# Patient Record
Sex: Male | Born: 1945 | Race: White | Hispanic: No | Marital: Married | Smoking: Former smoker
Health system: Southern US, Community
[De-identification: ages and names within clinical notes are randomized; demographics above are authoritative.]

## PROBLEM LIST (undated history)

## (undated) DIAGNOSIS — E119 Type 2 diabetes mellitus without complications: Secondary | ICD-10-CM

## (undated) DIAGNOSIS — E039 Hypothyroidism, unspecified: Secondary | ICD-10-CM

## (undated) DIAGNOSIS — I2510 Atherosclerotic heart disease of native coronary artery without angina pectoris: Secondary | ICD-10-CM

## (undated) DIAGNOSIS — I10 Essential (primary) hypertension: Secondary | ICD-10-CM

## (undated) DIAGNOSIS — E785 Hyperlipidemia, unspecified: Secondary | ICD-10-CM

## (undated) HISTORY — DX: Essential (primary) hypertension: I10

## (undated) HISTORY — DX: Hyperlipidemia, unspecified: E78.5

## (undated) HISTORY — DX: Atherosclerotic heart disease of native coronary artery without angina pectoris: I25.10

## (undated) HISTORY — DX: Hypothyroidism, unspecified: E03.9

## (undated) HISTORY — DX: Type 2 diabetes mellitus without complications: E11.9

---

## 1988-06-04 DIAGNOSIS — S2249XA Multiple fractures of ribs, unspecified side, initial encounter for closed fracture: Secondary | ICD-10-CM

## 1988-06-04 HISTORY — DX: Multiple fractures of ribs, unspecified side, initial encounter for closed fracture: S22.49XA

## 1995-06-29 DIAGNOSIS — Z951 Presence of aortocoronary bypass graft: Secondary | ICD-10-CM

## 1995-06-29 HISTORY — DX: Presence of aortocoronary bypass graft: Z95.1

## 1995-10-21 DIAGNOSIS — I459 Conduction disorder, unspecified: Secondary | ICD-10-CM

## 1995-10-21 HISTORY — DX: Conduction disorder, unspecified: I45.9

## 2019-12-26 ENCOUNTER — Telehealth: Payer: Self-pay | Admitting: Internal Medicine

## 2019-12-26 ENCOUNTER — Ambulatory Visit (INDEPENDENT_AMBULATORY_CARE_PROVIDER_SITE_OTHER): Payer: Medicare Other | Admitting: Internal Medicine

## 2019-12-26 ENCOUNTER — Encounter: Payer: Self-pay | Admitting: Internal Medicine

## 2019-12-26 ENCOUNTER — Other Ambulatory Visit: Payer: Self-pay

## 2019-12-26 DIAGNOSIS — R053 Chronic cough: Secondary | ICD-10-CM | POA: Diagnosis not present

## 2019-12-26 DIAGNOSIS — R9389 Abnormal findings on diagnostic imaging of other specified body structures: Secondary | ICD-10-CM | POA: Insufficient documentation

## 2019-12-26 DIAGNOSIS — I10 Essential (primary) hypertension: Secondary | ICD-10-CM | POA: Diagnosis not present

## 2019-12-26 MED ORDER — TELMISARTAN 80 MG PO TABS: ORAL_TABLET | ORAL | 11 refills | Status: DC

## 2019-12-26 MED ORDER — TELMISARTAN 80 MG PO TABS: 80.0000 mg | ORAL_TABLET | Freq: Every day | ORAL | 11 refills | Status: DC

## 2019-12-26 NOTE — Assessment & Plan Note (Signed)
See Cxr 7/217/21 ? 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

## 2019-12-26 NOTE — Assessment & Plan Note (Addendum)
Onset x years prior to flare in July 2021 with L sided pna that cleared -s/p abn CT chest 12/05/19 see sep a/p  - D/c Altace 12/26/2019     Most likely acei vs sinusitis vs bronchiecatsis (which might not have been obvious on non HRCT)    >>> start with trial off acei then regroup in 6 weeks

## 2019-12-26 NOTE — Telephone Encounter (Signed)
Called and spoke with Lauren at the pharmacy to let her know that notes from Provider states:  Micardis (ibesartan) 80 mg half daily and the cough should gradually resolve - take a whole pill if blood pressure too high  She expressed understanding. Nothing further needed at this time.

## 2019-12-26 NOTE — Progress Notes (Signed)
Julian Castro, male    DOB: [birth year]     MRN: [id number]   Brief patient profile:  73 yowm quit smoking in 2007 on then prn  combivent but good activity tolerance and eventually  not req any resp rx then acutely ill around 1st of July 2021 with lingering cough just white never bloody with abn cxr 10/17/19 "R basilar opactiy"  so referred to pulmonary clinic in [place]  12/26/2019 by [place] in [place] with abn CT chest 12/05/19 showing "vs 10/17/19"  GG changes in RML.     History of Present Illness  12/26/2019  Pulmonary/ 1st office eval/ Lexis Potenza / [place] [facility]  Chief Complaint  Patient presents with  . Pulmonary Consult    [facility]  Pt states he has a lingering cough- occ prod with white sputum.    Dyspnea:  Fine including hills  Cough: worse at hs with noisy breathing since pna in July 2021 but actually had chronic cough on acei x years prior to this per wife (bothered her, not pt)  Sleep: flat one pillow  SABA use: not needing   No obvious day to day or daytime variability or assoc  purulent sputum or mucus plugs or hemoptysis or cp or chest tightness, subjective wheeze or overt sinus or hb symptoms.   He perceives he is sleeping  without nocturnal  or early am exacerbation  of respiratory  c/o's or need for noct saba. Also denies any obvious fluctuation of symptoms with weather or environmental changes or other aggravating or alleviating factors except as outlined above   No unusual exposure hx or h/o childhood pna/ asthma or knowledge of premature birth.  Current Allergies, Complete Past Medical History, Past Surgical History, Family History, and Social History were reviewed in [employer] record.  ROS  The following are not active complaints unless bolded Hoarseness, sore throat, dysphagia, dental problems, itching, sneezing,  nasal congestion or discharge of excess mucus or purulent secretions, ear ache,   fever,  chills, sweats, unintended wt loss or wt gain, classically pleuritic or exertional cp,  orthopnea pnd or arm/hand swelling  or leg swelling, presyncope, palpitations, abdominal pain, anorexia, nausea, vomiting, diarrhea  or change in bowel habits or change in bladder habits, change in stools or change in urine, dysuria, hematuria,  rash, arthralgias, visual complaints, headache, numbness, weakness or ataxia or problems with walking or coordination,  change in mood or  memory.            No past medical history on file.  Outpatient Medications Prior to Visit  Medication Sig Dispense Refill  . aspirin EC 81 MG tablet Take 81 mg by mouth daily. Swallow whole.    Marland Kitchen atorvastatin (LIPITOR) 40 MG tablet Take 40 mg by mouth daily.    . B Complex-C (SUPER B COMPLEX PO) Take 1 tablet by mouth daily.    . insulin detemir (LEVEMIR) 100 UNIT/ML injection Inject 75 Units into the skin daily.    . Ipratropium-Albuterol (COMBIVENT RESPIMAT) 20-100 MCG/ACT AERS respimat Inhale 1 puff into the lungs every 6 (six) hours as needed for wheezing.    . metFORMIN (GLUCOPHAGE) 1000 MG tablet Take 1,000 mg by mouth 2 (two) times daily with a meal.    . ramipril (ALTACE) 2.5 MG capsule Take 2.5 mg by mouth daily.     No facility-administered medications prior to visit.     Objective:     BP Marland Kitchen)  160/70 (BP Location: Left Arm, Cuff Size: Normal)   Pulse 71   Temp (!) 97.5 F (36.4 C) (Temporal)   Ht 6' (1.829 m)   Wt 198 lb (89.8 kg)   SpO2 96% Comment: on RA  BMI 26.85 kg/m   SpO2: 96 % (on RA)   amb pleasant mw nad / somewhat rattling cough    HEENT : pt wearing mask not removed for exam due to covid -19 concerns.    NECK :  without JVD/Nodes/TM/ nl carotid upstrokes bilaterally   LUNGS: no acc muscle use,  Nl contour chest which is clear to A and P bilaterally without cough on insp or exp maneuvers   CV:  RRR  no s3 or murmur or increase in P2, and no edema   ABD:  soft and nontender with nl  inspiratory excursion in the supine position. No bruits or organomegaly appreciated, bowel sounds nl  MS:  Nl gait/ ext warm without deformities, calf tenderness, cyanosis or clubbing No obvious joint restrictions   SKIN: warm and dry without lesions    NEURO:  alert, approp, nl sensorium with  no motor or cerebellar deficits apparent.           Assessment   Abnormal CXR See Cxr 10/17/19 ? 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   Since the change seen are obvious on plain cxr and most likely benign, can just do f/u cxr in 6 weeks then repeat the CT at 6 months if any residual persists   Discussed in detail all the  indications, usual  risks and alternatives  relative to the benefits with patient who agrees to proceed with conservative f/u as outlined      Chronic cough Onset x years prior to flare in July 2021 with L sided pna that cleared -s/p abn CT chest 12/05/19 see sep a/p  - D/c Altace 12/26/2019     Most likely acei vs sinusitis vs bronchiecatsis (which might not have been obvious on non HRCT)    >>> start with trial off acei then regroup in 6 weeks     Essential hypertension In the best review of chronic cough to date ( NEJM 2016 [id number]) ,  ACEi are now felt to cause cough in up to  20% of pts which is a 4 fold increase from previous reports and does not include the variety of non-specific complaints we see in pulmonary clinic in pts on ACEi but previously attributed to another dx like  Copd/asthma and  include PNDS(which his wife is convinced is the problem) , throat and chest congestion, "bronchitis", unexplained dyspnea and noct "strangling" sensations, and hoarseness, but also  atypical /refractory GERD symptoms like dysphagia and "bad heartburn"   The only way I know  to prove this is not an "ACEi Case" is a trial off ACEi x a minimum of 6 weeks then regroup.   >>> try micardis 80 mg one half daily and regoup  in 6 weeks     Each maintenance medication was reviewed in detail including emphasizing most importantly the difference between maintenance and prns and under what circumstances the prns are to be triggered using an action plan format where appropriate.  Total time for H and P, chart review, counseling, and generating customized AVS unique to this office visit / charting  > 60 min  Sandrea Hughs, MD 12/26/2019

## 2019-12-26 NOTE — Patient Instructions (Signed)
Micardis (ibesartan) 80 mg one half daily and the cough should gradually resolve - take a whole pill if blood pressure too high  Stop altace   Please schedule a follow up office visit in 6 weeks, call sooner if needed with cxr on return

## 2019-12-26 NOTE — Assessment & Plan Note (Addendum)
In the best review of chronic cough to date ( NEJM 2016 375 [phone]) ,  ACEi are now felt to cause cough in up to  20% of pts which is a 4 fold increase from previous reports and does not include the variety of non-specific complaints we see in pulmonary clinic in pts on ACEi but previously attributed to another dx like  Copd/asthma and  include PNDS(which his wife is convinced is the problem) , throat and chest congestion, "bronchitis", unexplained dyspnea and noct "strangling" sensations, and hoarseness, but also  atypical /refractory GERD symptoms like dysphagia and "bad heartburn"   The only way I know  to prove this is not an "ACEi Case" is a trial off ACEi x a minimum of 6 weeks then regroup.   >>> try micardis 80 mg one half daily and regoup in 6 weeks           Each maintenance medication was reviewed in detail including emphasizing most importantly the difference between maintenance and prns and under what circumstances the prns are to be triggered using an action plan format where appropriate.  Total time for H and P, chart review, counseling, and generating customized AVS unique to this office visit / charting  > 60 min

## 2020-01-16 ENCOUNTER — Telehealth: Payer: Self-pay | Admitting: Internal Medicine

## 2020-01-16 ENCOUNTER — Other Ambulatory Visit: Payer: Self-pay | Admitting: Family Medicine

## 2020-01-16 ENCOUNTER — Other Ambulatory Visit (HOSPITAL_COMMUNITY): Payer: Self-pay | Admitting: Family Medicine

## 2020-01-16 DIAGNOSIS — R160 Hepatomegaly, not elsewhere classified: Secondary | ICD-10-CM

## 2020-01-16 MED ORDER — TELMISARTAN 80 MG PO TABS: 80.0000 mg | ORAL_TABLET | Freq: Every day | ORAL | 3 refills | Status: AC

## 2020-01-16 NOTE — Telephone Encounter (Signed)
Spoke with the pt  He is asking for refill on his micardis  He was getting elevated readings while taking the 1/2 tab daily so has to take the whole tablet  New rx was sent to [facility]

## 2020-01-22 ENCOUNTER — Ambulatory Visit (HOSPITAL_COMMUNITY)
Admission: RE | Admit: 2020-01-22 | Discharge: 2020-01-22 | Disposition: A | Discharge status: Home / Self Care | Payer: No Typology Code available for payment source | Source: Ambulatory Visit | Attending: Family Medicine | Admitting: Family Medicine

## 2020-01-22 ENCOUNTER — Other Ambulatory Visit: Payer: Self-pay

## 2020-01-22 ENCOUNTER — Ambulatory Visit (HOSPITAL_COMMUNITY)
Admission: RE | Admit: 2020-01-22 | Discharge: 2020-01-22 | Disposition: A | Discharge status: Home / Self Care | Payer: No Typology Code available for payment source | Source: Ambulatory Visit | Attending: Internal Medicine | Admitting: Internal Medicine

## 2020-01-22 DIAGNOSIS — R053 Chronic cough: Secondary | ICD-10-CM | POA: Insufficient documentation

## 2020-01-22 DIAGNOSIS — R160 Hepatomegaly, not elsewhere classified: Secondary | ICD-10-CM | POA: Diagnosis not present

## 2020-01-23 NOTE — Progress Notes (Signed)
Spoke with pt and notified of results per Dr. Wert. Pt verbalized understanding and denied any questions. 

## 2020-02-07 ENCOUNTER — Other Ambulatory Visit: Payer: Self-pay | Admitting: Internal Medicine

## 2020-02-07 DIAGNOSIS — R053 Chronic cough: Secondary | ICD-10-CM

## 2020-02-08 ENCOUNTER — Encounter: Payer: Self-pay | Admitting: Internal Medicine

## 2020-02-08 ENCOUNTER — Ambulatory Visit (HOSPITAL_COMMUNITY)
Admission: RE | Admit: 2020-02-08 | Discharge: 2020-02-08 | Disposition: A | Discharge status: Home / Self Care | Payer: Medicare Other | Source: Ambulatory Visit | Attending: Internal Medicine | Admitting: Internal Medicine

## 2020-02-08 ENCOUNTER — Ambulatory Visit (INDEPENDENT_AMBULATORY_CARE_PROVIDER_SITE_OTHER): Payer: Medicare Other | Admitting: Internal Medicine

## 2020-02-08 ENCOUNTER — Other Ambulatory Visit: Payer: Self-pay

## 2020-02-08 DIAGNOSIS — R9389 Abnormal findings on diagnostic imaging of other specified body structures: Secondary | ICD-10-CM

## 2020-02-08 DIAGNOSIS — R053 Chronic cough: Secondary | ICD-10-CM | POA: Diagnosis not present

## 2020-02-08 NOTE — Patient Instructions (Addendum)
Please remember to go to the  x-ray department  @  [facility] for your tests - we will call you with the results when they are available      Ok to continue the altace unless the cough is a problem - discuss with your PCP  Add rec f/u ct 6 m from 12/05/19 placed in reminder file

## 2020-02-08 NOTE — Progress Notes (Signed)
Vivien Presto, male    DOB: 1945/07/01     MRN: [id number]   Brief patient profile:  70 yowm quit smoking in 2007 on then prn  combivent but good activity tolerance and eventually  not req any resp rx then acutely ill around 1st of July 2021 with lingering cough just white never bloody with abn cxr 10/17/19 "R basilar opactiy"  so referred to pulmonary clinic in [place]  12/26/2019 by [place] in [place] with abn CT chest 12/05/19 showing "vs 10/17/19"  GG changes in RML.     History of Present Illness  12/26/2019  Pulmonary/ 1st office eval/ Vivianna Piccini / [place] [facility]  Chief Complaint  Patient presents with  . Pulmonary Consult    [facility]  Pt states he has a lingering cough- occ prod with white sputum.    Dyspnea:  Fine including hills  Cough: worse at hs with noisy breathing since pna in July 2021 but actually had chronic cough on acei x years prior to this per wife (bothered her, not pt)  Sleep: flat one pillow  SABA use: not needing  rec Micardis (ibesartan) 80 mg one half daily and the cough should gradually resolve - take a whole pill if blood pressure too high Stop altace Please schedule a follow up office visit in 6 weeks, call sooner if needed with cxr on return    02/08/2020  f/u ov/[place] office/Tomasa Dobransky re: changed back to altace  X about 2 weeks because the blood pressure was too high and no cough since started back  Chief Complaint  Patient presents with  . Follow-up    No complaints currently  Dyspnea:  [place]  Cough: not even throat clearing at this pont  Sleeping: flat bed one pillow SABA use: none  02: none   No obvious day to day or daytime variability or assoc excess/ purulent sputum or mucus plugs or hemoptysis or cp or chest tightness, subjective wheeze or overt sinus or hb symptoms.   Sleeping as above  without nocturnal  or early am exacerbation  of respiratory  c/o's or need for noct  saba. Also denies any obvious fluctuation of symptoms with weather or environmental changes or other aggravating or alleviating factors except as outlined above   No unusual exposure hx or h/o childhood pna/ asthma or knowledge of premature birth.  Current Allergies, Complete Past Medical History, Past Surgical History, Family History, and Social History were reviewed in [employer] record.  ROS  The following are not active complaints unless bolded Hoarseness, sore throat, dysphagia, dental problems, itching, sneezing,  nasal congestion or discharge of excess mucus or purulent secretions, ear ache,   fever, chills, sweats, unintended wt loss or wt gain, classically pleuritic or exertional cp,  orthopnea pnd or arm/hand swelling  or leg swelling, presyncope, palpitations, abdominal pain, anorexia, nausea, vomiting, diarrhea  or change in bowel habits or change in bladder habits, change in stools or change in urine, dysuria, hematuria,  rash, arthralgias, visual complaints, headache, numbness, weakness or ataxia or problems with walking or coordination,  change in mood or  memory.        Current Meds  -  - NOTE:   Unable to verify as accurately reflecting what pt takes     Medication Sig  . aspirin EC 81 MG tablet Take 81 mg by mouth daily. Swallow whole.  Marland Kitchen atorvastatin (LIPITOR) 40 MG tablet  Take 40 mg by mouth daily.  . B Complex-C (SUPER B COMPLEX PO) Take 1 tablet by mouth daily.  . insulin detemir (LEVEMIR) 100 UNIT/ML injection Inject 75 Units into the skin daily.  . Ipratropium-Albuterol (COMBIVENT RESPIMAT) 20-100 MCG/ACT AERS respimat Inhale 1 puff into the lungs every 6 (six) hours as needed for wheezing.  . metFORMIN (GLUCOPHAGE) 1000 MG tablet Take 1,000 mg by mouth 2 (two) times daily with a meal.  . telmisartan (MICARDIS) 80 MG tablet Off for 2 weeks and back on his prior dose of altace                      Objective:      Wt Readings from Last 3  Encounters:  02/08/20 203 lb 3.2 oz (92.2 kg)  12/26/19 198 lb (89.8 kg)     Vital signs reviewed - Note on arrival 02/08/2020  02 sats  97% on RA     HEENT : pt wearing mask not removed for exam due to covid -19 concerns.    NECK :  without JVD/Nodes/TM/ nl carotid upstrokes bilaterally   LUNGS: no acc muscle use,  Nl contour chest which is clear to A and P bilaterally without cough on insp or exp maneuvers   CV:  RRR  no s3 or murmur or increase in P2, and no edema   ABD:  soft and nontender with nl inspiratory excursion in the supine position. No bruits or organomegaly appreciated, bowel sounds nl  MS:  Nl gait/ ext warm without deformities, calf tenderness, cyanosis or clubbing No obvious joint restrictions   SKIN: warm and dry without lesions    NEURO:  alert, approp, nl sensorium with  no motor or cerebellar deficits apparent.       CXR PA and Lateral:   02/08/2020 :    I personally reviewed images and agree with radiology impression as follows:   No change since prior cxr - some mild scarring likely in bases            Assessment

## 2020-02-09 ENCOUNTER — Encounter: Payer: Self-pay | Admitting: Internal Medicine

## 2020-02-09 NOTE — Assessment & Plan Note (Signed)
Onset x years prior to flare in July 2021 with L sided pna that cleared -s/p abn CT chest 12/05/19 see sep a/p  - D/c Altace 12/26/2019   > resolved and did not recur when started back  I explained cough is likely to recur if here is an initial trigger like a uri and short term substitution with arb may be needed but for now he's satisfied and is wife is not bothered at this point by the throat clearing

## 2020-02-09 NOTE — Assessment & Plan Note (Addendum)
See Cxr 7/217/21 ? 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            Each maintenance medication was reviewed in detail including emphasizing most importantly the difference between maintenance and prns and under what circumstances the prns are to be triggered using an action plan format where appropriate.  Total time for H and P, chart review, counseling, teaching device and generating customized AVS unique to this office visit / charting  > 

## 2020-02-13 NOTE — Progress Notes (Signed)
Spoke with the pt. He was made aware of results and recommendations. He declined any future scans. Will forward to Dr Sherene Sires as [name] he refused the CT.

## 2020-04-23 DIAGNOSIS — Z9889 Other specified postprocedural states: Secondary | ICD-10-CM

## 2020-04-23 HISTORY — DX: Other specified postprocedural states: Z98.890

## 2020-05-29 ENCOUNTER — Telehealth: Payer: Self-pay | Admitting: *Deleted

## 2020-05-29 DIAGNOSIS — R9389 Abnormal findings on diagnostic imaging of other specified body structures: Secondary | ICD-10-CM

## 2020-05-29 NOTE — Telephone Encounter (Signed)
-----   Message from Nyoka Cowden, MD sent at 02/09/2020  5:41 AM EST ----- Ct s contrast due 06/03/20  f/u pulmonary infiltrates

## 2020-05-29 NOTE — Telephone Encounter (Signed)
LMTCB- need to remind pt he is due before we order

## 2020-05-30 NOTE — Telephone Encounter (Signed)
Spoke with the pt and notified due to ct chest  He verbalized undertanding Prefers to have this done at [facility]  Order placed

## 2020-06-17 ENCOUNTER — Ambulatory Visit (HOSPITAL_COMMUNITY)
Admission: RE | Admit: 2020-06-17 | Discharge: 2020-06-17 | Disposition: A | Discharge status: Home / Self Care | Payer: Medicare Other | Source: Ambulatory Visit | Attending: Internal Medicine | Admitting: Internal Medicine

## 2020-06-17 ENCOUNTER — Other Ambulatory Visit: Payer: Self-pay

## 2020-06-17 DIAGNOSIS — R9389 Abnormal findings on diagnostic imaging of other specified body structures: Secondary | ICD-10-CM | POA: Insufficient documentation

## 2020-06-18 NOTE — Progress Notes (Signed)
Called and went over CT results per Dr Sherene Sires with patient. All questions answered and patient expressed full understanding. Copy routed to PCP per Dr Sherene Sires,  patient aware. Nothing further needed at this time.

## 2022-09-09 ENCOUNTER — Encounter: Payer: Self-pay | Admitting: Neurology

## 2022-10-14 NOTE — Progress Notes (Signed)
Initial neurology clinic note  Reason for Evaluation: Consultation requested by Zachery Dauer, MD for an opinion regarding difficulty walking, tremors, and numbness in his feet. My final recommendations will be communicated back to the requesting physician by way of shared medical record or letter to requesting physician via [place] mail.  HPI: This is Mr. Julian Castro, a 77 y.o. right-handed male with a medical history of hypothyroidism, DM c/b neuropathy, CAD s/p CABG, HTN, HLD who presents to neurology clinic with the chief complaint of difficulty walking, tremors, and numbness in his feet. The patient is accompanied by wife.  Patient does not think he has problems with his walking. He has fallen a few times in the yard, but he thinks this can be explained. Patient has had numbness in his feet for at least 7 years. He has no pain. He does not think his legs are weak. He denies weakness in the hands. He sometimes has numbness in his finger tips. He denies frequently leaning on his elbows. He denies muscle cramps or twitching.  The patient denies symptoms suggestive of oculobulbar weakness including diplopia, ptosis, dysphagia, poor saliva control, dysarthria/dysphonia, impaired mastication, facial weakness/droop.  There are no neuromuscular respiratory weakness symptoms, particularly orthopnea>dyspnea.   Pseudobulbar affect is absent.  He does not report any constitutional symptoms like fever, night sweats, anorexia or unintentional weight loss.  Regarding his tremor, it started 5-7 years ago. The tremors are in his hands and neck. It is more pronounced when he is trying to use his hands. The tremor causing difficulty with eating and drinking. He was previously on a medication for tremor. He is not sure what the medication was, he isn't sure it helped, but he felt like he didn't want to do anything. Wife thinks the medication might have been primidone. Patient is currently not on  medication.  Patient takes B complex and a multivitamin.  EtOH use: Rare  Restrictive diet? No Family history of neuropathy/myopathy/neurologic disease? No  He has never had an EMG.  MEDICATIONS:  Outpatient Encounter Medications as of 10/21/2022  Medication Sig   amLODipine (NORVASC) 2.5 MG tablet Take 2.5 mg by mouth daily.   aspirin EC 81 MG tablet Take 81 mg by mouth daily. Swallow whole.   atorvastatin (LIPITOR) 40 MG tablet Take 40 mg by mouth daily.   B Complex-C (SUPER B COMPLEX PO) Take 1 tablet by mouth daily.   fluticasone-salmeterol (WIXELA INHUB) 100-50 MCG/ACT AEPB Inhale 1 puff into the lungs 2 (two) times daily.   insulin glargine-yfgn (SEMGLEE) 100 UNIT/ML injection Inject 40 Units into the skin in the morning and at bedtime.   Ipratropium-Albuterol (COMBIVENT RESPIMAT) 20-100 MCG/ACT AERS respimat Inhale 1 puff into the lungs every 6 (six) hours as needed for wheezing.   levothyroxine (SYNTHROID) 88 MCG tablet Take 88 mcg by mouth daily before breakfast.   metFORMIN (GLUCOPHAGE) 1000 MG tablet Take 1,000 mg by mouth 2 (two) times daily with a meal.   propranolol (INDERAL) 20 MG tablet Take 1 tablet (20 mg total) by mouth 2 (two) times daily.   insulin detemir (LEVEMIR) 100 UNIT/ML injection Inject 75 Units into the skin daily. (Patient not taking: Reported on 10/21/2022)   telmisartan (MICARDIS) 80 MG tablet Take 1 tablet (80 mg total) by mouth daily. (Patient not taking: Reported on 10/21/2022)   No facility-administered encounter medications on file as of 10/21/2022.    PAST MEDICAL HISTORY: Past Medical History:  Diagnosis Date   Broken ribs 06/04/1988  9th and 10th rib   colonoscopy 04/23/2020   Coronary artery disease    Diabetes mellitus without complication (HCC)    Heart bloc stents 10/21/1995   heart bypass 06/29/1995   HLD (hyperlipidemia)    Hypertension    Hypothyroidism     PAST SURGICAL HISTORY: History reviewed. No pertinent surgical  history.  ALLERGIES: No Known Allergies  FAMILY HISTORY: Family History  Problem Relation Age of Onset   Lung cancer Mother    Cancer - Other Father     SOCIAL HISTORY: Social History   Tobacco Use   Smoking status: Former    Current packs/day: 0.00    Average packs/day: 2.5 packs/day for 40.0 years (100.0 ttl pk-yrs)    Types: Cigarettes    Start date: 03/23/1965    Quit date: 03/23/2005    Years since quitting: 17.5   Smokeless tobacco: Never  Vaping Use   Vaping status: Never Used  Substance Use Topics   Alcohol use: Not Currently   Social History   Social History Narrative   Are you right handed or left handed? RIGHT   Are you currently employed ?    What is your current occupation?RETIRED   Do you live at home alone?   Who lives with you? WIFE   What type of home do you live in: 1 story or 2 story? ONE    CAFFEINE 2 CUPS A DAY     OBJECTIVE: PHYSICAL EXAM: BP (!) 161/75   Pulse 85   Ht 5' 11.5" (1.816 m)   Wt 205 lb (93 kg)   SpO2 95%   BMI 28.19 kg/m   General: General appearance: Awake and alert. No distress. Cooperative with exam.  Skin: No obvious rash or jaundice. HEENT: Atraumatic. Anicteric. Lungs: Conversational dyspnea Extremities: No edema. No obvious deformity.  Psych: Affect appropriate.  Neurological: Mental Status: Alert. Speech fluent. No pseudobulbar affect Cranial Nerves: CNII: No RAPD. Visual fields grossly intact. CNIII, IV, VI: PERRL. No nystagmus. EOMI. CN V: Facial sensation intact bilaterally to fine touch. Masseter clench strong. Jaw jerk negative. CN VII: Facial muscles symmetric and strong. No ptosis at rest. CN VIII: Hearing grossly intact bilaterally. CN IX: No hypophonia. CN X: Palate elevates symmetrically. CN XI: Full strength shoulder shrug bilaterally. CN XII: Tongue protrusion full and midline. No atrophy or fasciculations. No significant dysarthria Motor: Tone is normal. No fasciculations in  extremities. Atrophy of bilateral FDI, ADM, and APB  Individual muscle group testing (MRC grade out of 5):  Movement     Neck flexion 5    Neck extension 5     Right Left   Shoulder abduction 5 5   Elbow flexion 5 5   Elbow extension 5 5   Finger abduction - FDI 4 4   Finger abduction - ADM 4 4   Finger extension 5- 5-   Finger distal flexion - 2/3 4+ 4+   Finger distal flexion - 4/5 4+ 4+   Thumb flexion - FPL 4+ 4+   Thumb abduction - APB 4 4    Hip flexion 5 5   Hip extension 5 5   Hip adduction 5 5   Hip abduction 5 5   Knee extension 5 5   Knee flexion 5 5   Dorsiflexion 2 2   Plantarflexion 5 5   Inversion 4+ 4+   Eversion 4+ 4+     Reflexes:  Right Left   Bicep 0 0   Tricep 0  0   BrRad 0 0   Knee 0 0   Ankle 0 0    Pathological Reflexes: Babinski: mute response bilaterally Hoffman: absent bilaterally Troemner: absent bilaterally Facial: Absent bilaterally Midline tap: Absent Sensation: Pinprick: Diminished in bilateral hands and legs below the calves Vibration: Intact in upper extremities, absent in lower extremities to the knees Proprioception: Absent in bilateral great toes Coordination: Intact finger-to- nose-finger bilaterally. Romberg significant sway. Gait: Some difficulty rising from chair with arms crossed unassisted. Steppage gait. Unsteady. Unable to walk on toes and heels.  Lab and Test Review: External labs: 09/03/22: HbA1c: 5.8 CMP unremarkable CBC unremarkable TSH wnl Lipid panel: total cholesterol 120, LDL 71  ASSESSMENT: Emanuell Morina is a 77 y.o. male who presents for evaluation of numbness in legs, leg and hand weakness, imbalance, falls, and tremor. He has a relevant medical history of hypothyroidism, DM c/b neuropathy, CAD s/p CABG, HTN, HLD. His neurological examination is pertinent for intrinsic hand muscle weakness and trophy, bilateral foot drop, numbness of legs and hands, and gait imbalance. Available diagnostic data is  significant for HbA1c of 5.8 (unclear past levels). Patient has significant weakness and sensory deficits that may be secondary to neuropathy (?secondary to diabetes). His level of weakness and atrophy appears out of proportion to current HbA1c though. I would like to investigate further with labs looking for treatable causes and EMG.  PLAN: -Blood work: B1, B12, IFE -EMG: PN protocol (R > L) -PT/OT ([place], [place] preferred) - patient will likely need AFO -For essential tremor, will start propranolol 20 mg BID  -Return to clinic in 6 months  The impression above as well as the plan as outlined below were extensively discussed with the patient (in the company of wife) who voiced understanding. All questions were answered to their satisfaction.  The patient was counseled on pertinent fall precautions per the printed material provided today, and as noted under the "Patient Instructions" section below.  When available, results of the above investigations and possible further recommendations will be communicated to the patient via telephone/MyChart. Patient to call office if not contacted after expected testing turnaround time.   Total time spent reviewing records, interview, history/exam, documentation, and coordination of care on day of encounter:  50 min   Thank you for allowing me to participate in patient's care.  If I can answer any additional questions, I would be pleased to do so.  Jacquelyne Balint, MD   CC: Zachery Dauer, MD [address] [place]  CC: Referring provider: Zachery Dauer, MD [address] [place],  [place]

## 2022-10-21 ENCOUNTER — Other Ambulatory Visit (INDEPENDENT_AMBULATORY_CARE_PROVIDER_SITE_OTHER): Payer: Medicare Other

## 2022-10-21 ENCOUNTER — Ambulatory Visit (INDEPENDENT_AMBULATORY_CARE_PROVIDER_SITE_OTHER): Payer: Medicare Other | Admitting: Neurology

## 2022-10-21 ENCOUNTER — Encounter: Payer: Self-pay | Admitting: Neurology

## 2022-10-21 VITALS — BP 161/75 | HR 85 | Ht 71.5 in | Wt 205.0 lb

## 2022-10-21 DIAGNOSIS — R2689 Other abnormalities of gait and mobility: Secondary | ICD-10-CM

## 2022-10-21 DIAGNOSIS — R200 Anesthesia of skin: Secondary | ICD-10-CM | POA: Diagnosis not present

## 2022-10-21 DIAGNOSIS — M21372 Foot drop, left foot: Secondary | ICD-10-CM

## 2022-10-21 DIAGNOSIS — R269 Unspecified abnormalities of gait and mobility: Secondary | ICD-10-CM

## 2022-10-21 DIAGNOSIS — G629 Polyneuropathy, unspecified: Secondary | ICD-10-CM

## 2022-10-21 DIAGNOSIS — G250 Essential tremor: Secondary | ICD-10-CM | POA: Diagnosis not present

## 2022-10-21 DIAGNOSIS — M21371 Foot drop, right foot: Secondary | ICD-10-CM | POA: Diagnosis not present

## 2022-10-21 DIAGNOSIS — R29898 Other symptoms and signs involving the musculoskeletal system: Secondary | ICD-10-CM

## 2022-10-21 DIAGNOSIS — W19XXXS Unspecified fall, sequela: Secondary | ICD-10-CM

## 2022-10-21 MED ORDER — PROPRANOLOL HCL 20 MG PO TABS
20.0000 mg | ORAL_TABLET | Freq: Two times a day (BID) | ORAL | 6 refills | Status: AC
Start: 2022-10-21 — End: ?

## 2022-10-21 NOTE — Patient Instructions (Addendum)
I saw you today for weakness, imbalance, falls, numbness in legs, and tremor.  I would like to investigate your symptoms further with: -Blood work today -Muscle and nerve test called EMG (see more information below)  I will be in touch when I have those results.  Your tremor is likely a tremor called essential tremor. We will try lose dose medication for this. I have sent propranolol 20 mg twice daily to your pharmacy.  The physicians and staff at [facility] are committed to providing excellent care. You may receive a survey requesting feedback about your experience at our office. We strive to receive "very good" responses to the survey questions. If you feel that your experience would prevent you from giving the office a "very good " response, please contact our office to try to remedy the situation. We may be reached at [phone]. Thank you for taking the time out of your busy day to complete the survey.  Jacquelyne Balint, MD [facility]  ELECTROMYOGRAM AND NERVE CONDUCTION STUDIES (EMG/NCS) INSTRUCTIONS  How to Prepare The neurologist conducting the EMG will need to know if you have certain medical conditions. Tell the neurologist and other EMG lab personnel if you: Have a pacemaker or any other electrical medical device Take blood-thinning medications Have hemophilia, a blood-clotting disorder that causes prolonged bleeding Bathing Take a shower or bath shortly before your exam in order to remove oils from your skin. Don't apply lotions or creams before the exam.  What to Expect You'll likely be asked to change into a hospital gown for the procedure and lie down on an examination table. The following explanations can help you understand what will happen during the exam.  Electrodes. The neurologist or a technician places surface electrodes at various locations on your skin depending on where you're experiencing symptoms. Or the neurologist may insert needle electrodes at  different sites depending on your symptoms.  Sensations. The electrodes will at times transmit a tiny electrical current that you may feel as a twinge or spasm. The needle electrode may cause discomfort or pain that usually ends shortly after the needle is removed. If you are concerned about discomfort or pain, you may want to talk to the neurologist about taking a short break during the exam.  Instructions. During the needle EMG, the neurologist will assess whether there is any spontaneous electrical activity when the muscle is at rest - activity that isn't present in healthy muscle tissue - and the degree of activity when you slightly contract the muscle.  He or she will give you instructions on resting and contracting a muscle at appropriate times. Depending on what muscles and nerves the neurologist is examining, he or she may ask you to change positions during the exam.  After your EMG You may experience some temporary, minor bruising where the needle electrode was inserted into your muscle. This bruising should fade within several days. If it persists, contact your primary care doctor.    Preventing Falls at [facility] are common, often dreaded events in the lives of older people. Aside from the obvious injuries and even death that may result, fall can cause wide-ranging consequences including loss of independence, mental decline, decreased activity and mobility. Younger people are also at risk of falling, especially those with chronic illnesses and fatigue.  Ways to reduce risk for falling Examine diet and medications. Warm foods and alcohol dilate blood vessels, which can lead to dizziness when standing. Sleep aids, antidepressants and pain medications can  also increase the likelihood of a fall.  Get a vision exam. Poor vision, cataracts and glaucoma increase the chances of falling.  Check foot gear. Shoes should fit snugly and have a sturdy, nonskid sole and a broad, low  heel  Participate in a physician-approved exercise program to build and maintain muscle strength and improve balance and coordination. Programs that use ankle weights or stretch bands are excellent for muscle-strengthening. Water aerobics programs and low-impact Tai Chi programs have also been shown to improve balance and coordination.  Increase vitamin D intake. Vitamin D improves muscle strength and increases the amount of calcium the body is able to absorb and deposit in bones.  How to prevent falls from common hazards Floors - Remove all loose wires, cords, and throw rugs. Minimize clutter. Make sure rugs are anchored and smooth. Keep furniture in its usual place.  Chairs -- Use chairs with straight backs, armrests and firm seats. Add firm cushions to existing pieces to add height.  Bathroom - Install grab bars and non-skid tape in the tub or shower. Use a bathtub transfer bench or a shower chair with a back support Use an elevated toilet seat and/or safety rails to assist standing from a low surface. Do not use towel racks or bathroom tissue holders to help you stand.  Lighting - Make sure halls, stairways, and entrances are well-lit. Install a night light in your bathroom or hallway. Make sure there is a light switch at the top and bottom of the staircase. Turn lights on if you get up in the middle of the night. Make sure lamps or light switches are within reach of the bed if you have to get up during the night.  Kitchen - Install non-skid rubber mats near the sink and stove. Clean spills immediately. Store frequently used utensils, pots, pans between waist and eye level. This helps prevent reaching and bending. Sit when getting things out of lower cupboards.  Living room/ Bedrooms - Place furniture with wide spaces in between, giving enough room to move around. Establish a route through the living room that gives you something to hold onto as you walk.  Stairs - Make sure treads, rails, and  rugs are secure. Install a rail on both sides of the stairs. If stairs are a threat, it might be helpful to arrange most of your activities on the lower level to reduce the number of times you must climb the stairs.  Entrances and doorways - Install metal handles on the walls adjacent to the doorknobs of all doors to make it more secure as you travel through the doorway.  Tips for maintaining balance Keep at least one hand free at all times. Try using a backpack or fanny pack to hold things rather than carrying them in your hands. Never carry objects in both hands when walking as this interferes with keeping your balance.  Attempt to swing both arms from front to back while walking. This might require a conscious effort if Parkinson's disease has diminished your movement. It will, however, help you to maintain balance and posture, and reduce fatigue.  Consciously lift your feet off of the ground when walking. Shuffling and dragging of the feet is a common culprit in losing your balance.  When trying to navigate turns, use a "U" technique of facing forward and making a wide turn, rather than pivoting sharply.  Try to stand with your feet shoulder-length apart. When your feet are close together for any length of time, you  increase your risk of losing your balance and falling.  Do one thing at a time. Don't try to walk and accomplish another task, such as reading or looking around. The decrease in your automatic reflexes complicates motor function, so the less distraction, the better.  Do not wear rubber or gripping soled shoes, they might "catch" on the floor and cause tripping.  Move slowly when changing positions. Use deliberate, concentrated movements and, if needed, use a grab bar or walking aid. Count 15 seconds between each movement. For example, when rising from a seated position, wait 15 seconds after standing to begin walking.  If balance is a continuous problem, you might want to  consider a walking aid such as a cane, walking stick, or walker. Once you've mastered walking with help, you might be ready to try it on your own again.

## 2022-11-03 ENCOUNTER — Telehealth: Payer: Self-pay | Admitting: Neurology

## 2022-11-03 NOTE — Telephone Encounter (Signed)
Caller states she is calling to talk about report about b12 shots to the pcp

## 2022-11-04 ENCOUNTER — Other Ambulatory Visit: Payer: Self-pay

## 2022-11-04 NOTE — Telephone Encounter (Signed)
Called and pt would like labs to be sent to PCP in [place] , Dr, Suzy Bouchard on [address]. Fax [phone]

## 2022-11-05 ENCOUNTER — Other Ambulatory Visit: Payer: Self-pay

## 2022-11-16 ENCOUNTER — Ambulatory Visit: Payer: Medicare Other | Admitting: Neurology

## 2022-11-16 DIAGNOSIS — G250 Essential tremor: Secondary | ICD-10-CM | POA: Diagnosis not present

## 2022-11-16 DIAGNOSIS — R29898 Other symptoms and signs involving the musculoskeletal system: Secondary | ICD-10-CM

## 2022-11-16 DIAGNOSIS — R269 Unspecified abnormalities of gait and mobility: Secondary | ICD-10-CM

## 2022-11-16 DIAGNOSIS — R200 Anesthesia of skin: Secondary | ICD-10-CM

## 2022-11-16 DIAGNOSIS — W19XXXS Unspecified fall, sequela: Secondary | ICD-10-CM

## 2022-11-16 DIAGNOSIS — M21371 Foot drop, right foot: Secondary | ICD-10-CM

## 2022-11-16 DIAGNOSIS — G629 Polyneuropathy, unspecified: Secondary | ICD-10-CM

## 2022-11-16 DIAGNOSIS — R2689 Other abnormalities of gait and mobility: Secondary | ICD-10-CM

## 2022-11-16 NOTE — Procedures (Addendum)
[facility]  [address] [place], [address]  [place], [place] [address] Tel: [phone] Fax: [phone] Test Date:  11/16/2022  Patient: Julian Castro DOB: 1945/07/11 Physician: Jacquelyne Balint, MD  Sex: Male Height: 5\' 11"  Ref Phys: Jacquelyne Balint, MD  ID#: [id number]   Technician:    History: This is a 77 year old male with numbness in feet, difficulty walking, and falls.  NCV & EMG Findings: Extensive electrodiagnostic evaluation of the right upper and lower limbs shows: Right sural, superficial peroneal/fibular, median, and ulnar sensory responses are absent. Right radial sensory response is within normal limits. Right peroneal/fibular (EDB) and tibial (AH) motor responses are absent. Right peroneal/fibular (TA), median (APB), and ulnar (ADM) motor responses show reduced amplitudes (TA 1.44 mV, APB 1.80 mV, ADM 3.3 mV). Right ulnar (ADM) conduction velocities are decreased from below elbow to wrist (34 ms) and above elbow to below elbow (40 ms). Right H reflex is absent. Chronic motor axon loss changes WITH active denervation changes are seen in the right tibialis anterior and medial head of gastrocnemius muscles. Chronic motor axon loss changes WITHOUT evidence of active denervation changes are seen in the right short head of biceps femoris, first dorsal interosseous, extensor indicis proprius, abductor pollicis brevis, pronator teres, and flexor carpi ulnaris muscles. Lumbosacral (L5 level) and cervical (C7 level) paraspinal muscles are within normal limits.  Impression: This is an abnormal study. 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 No definitive electrodiagnostic evidence of a right cervical (C5-T1) or lumbosacral (L3-S1) motor radiculopathy. No electrodiagnostic evidence of a right median mononeuropathy at or distal to the wrist,  ie: carpal tunnel syndrome. An overlapping right ulnar mononeuropathy is possible, but changes may be due to #1 above.    ___________________________ Jacquelyne Balint, MD    Nerve Conduction Studies Motor Nerve Results    Latency Amplitude F-Lat Segment Distance CV Comment  Site (ms) Norm (mV) Norm (ms)  (cm) (m/s) Norm   Right Fibular (EDB) Motor  Ankle *NR  < 6.0 *NR  > 2.5        Bel fib head *NR - *NR -  Bel fib head-Ankle - *NR  > 40   Pop fossa *NR - *NR -  Pop fossa-Bel fib head - *NR -   Right Fibular (TA) Motor  Fib head 3.0  < 4.5 *1.44  > 3.0        Pop fossa 4.9  < 6.7 1.14 -  Pop fossa-Fib head 10 53  > 40   Right Median (APB) Motor  Wrist 3.7  < 4.0 *1.80  > 5.0        Elbow 10.4 - 0.50 -  Elbow-Wrist 30.5 46  > 50   Post exercise 2.7 - 1.56 -        Right Tibial (AH) Motor  Ankle *NR  < 6.0 *NR  > 4.0        Knee *NR - *NR -  Knee-Ankle - *NR  > 40   Right Ulnar (ADM) Motor  Wrist 3.1  < 3.1 *3.3  > 7.0        Bel elbow 9.8 - 3.3 -  Bel elbow-Wrist 23 *34  > 50   Ab elbow 12.3 - 3.1 -  Ab elbow-Bel elbow 10 40 -   Post exercise 3.0 - 3.9 -         Sensory  Sites    Neg Peak Lat Amplitude (O-P) Segment Distance Velocity Comment  Site (ms) Norm (V) Norm  (cm) (ms)   Right Median Sensory  Wrist-Dig II *NR  < 3.8 *NR  > 10 Wrist-Dig II 13    Right Radial Sensory  Forearm-Wrist 2.0  < 2.8 17  > 10 Forearm-Wrist 10    Right Superficial Fibular Sensory  14 cm-Ankle *NR  < 4.6 *NR  > 3 14 cm-Ankle 14    Right Sural Sensory  Calf-Lat mall *NR  < 4.6 *NR  > 3 Calf-Lat mall 14    Right Ulnar Sensory  Wrist-Dig V *NR  < 3.2 *NR  > 5 Wrist-Dig V 11     H-Reflex Results    M-Lat H Lat H Neg Amp H-M Lat  Site (ms) (ms) Norm (mV) (ms)  Right Tibial H-Reflex  Pop fossa 7.1 ---  < 35.0 --- ---   Electromyography   Side Muscle Ins.Act Fibs Fasc Recrt Amp Dur Poly Activation Comment  Right Tib ant Nml *2+ Nml *SMU *1+ *2+ *2+ Nml *MMAV  Right Gastroc MH Nml *1+ Nml  *3- *1+ *1+ *1+ Nml N/A  Right Rectus fem Nml Nml Nml Nml Nml Nml Nml Nml N/A  Right Biceps fem SH Nml Nml Nml *2- *1+ *1+ *1+ Nml N/A  Right Gluteus med Nml Nml Nml Nml Nml Nml Nml Nml N/A  Right L5 PSP Nml Nml Nml Nml Nml Nml Nml Nml N/A  Right FDI Nml Nml Nml *SMU *2+ *2+ *2+ Nml N/A  Right EIP Nml Nml Nml *3- *1+ *1+ *1+ Nml N/A  Right APB Nml Nml Nml *3- *1+ *1+ *1+ Nml N/A  Right Pronator teres Nml Nml Nml *2- *1+ *1+ *1+ Nml N/A  Right FCU Nml Nml Nml *2- *1+ *1+ *1+ Nml N/A  Right Biceps Nml Nml Nml Nml Nml Nml Nml Nml N/A  Right Triceps lat hd Nml Nml Nml Nml Nml Nml Nml Nml N/A  Right Deltoid Nml Nml Nml Nml Nml Nml Nml Nml N/A  Right C7 PSP Nml Nml Nml Nml Nml Nml Nml Nml N/A      Waveforms:  Motor             Sensory             H-Reflex

## 2022-11-17 ENCOUNTER — Telehealth: Payer: Self-pay | Admitting: Neurology

## 2022-11-17 NOTE — Telephone Encounter (Signed)
Attempted to called patient, but primary number was out of service. Secondary number was not answered, but went to VM for his wife. I left a message asking for a call back to our office.  I want to discuss EMG results with patient and need for more testing due to severe neuropathy seen, including lumbar puncture and more serum blood tests.  Jacquelyne Balint, MD [facility]

## 2022-11-19 ENCOUNTER — Telehealth: Payer: Self-pay | Admitting: Neurology

## 2022-11-19 ENCOUNTER — Other Ambulatory Visit: Payer: Self-pay

## 2022-11-19 DIAGNOSIS — M21371 Foot drop, right foot: Secondary | ICD-10-CM

## 2022-11-19 DIAGNOSIS — R200 Anesthesia of skin: Secondary | ICD-10-CM

## 2022-11-19 DIAGNOSIS — G250 Essential tremor: Secondary | ICD-10-CM

## 2022-11-19 DIAGNOSIS — R269 Unspecified abnormalities of gait and mobility: Secondary | ICD-10-CM

## 2022-11-19 DIAGNOSIS — G629 Polyneuropathy, unspecified: Secondary | ICD-10-CM

## 2022-11-19 DIAGNOSIS — R29898 Other symptoms and signs involving the musculoskeletal system: Secondary | ICD-10-CM

## 2022-11-19 DIAGNOSIS — W19XXXS Unspecified fall, sequela: Secondary | ICD-10-CM

## 2022-11-19 DIAGNOSIS — R2689 Other abnormalities of gait and mobility: Secondary | ICD-10-CM

## 2022-11-19 NOTE — Telephone Encounter (Signed)
Spoke to patient's wife as he was currently sleeping. I advised that his EMG showed a bad neuropathy and that I recommended further testing to look for a treatable cause. This would include a lumbar puncture and more serum testing. Wife states she is not sure patient would do it and was not happy after the discomfort of the EMG. I expressed my understanding. Wife will discuss with patient but would like me to call back as he would do better hearing it from me. I told her I would try to call back later when he was awake.  All questions were answered.  Jacquelyne Balint, MD [facility]

## 2022-11-19 NOTE — Telephone Encounter (Signed)
Called to discussed EMG results and spoke with patient. EMG showed a severe neuropathy. His known risk factors are DM (HbA1c 6.0) and low B12 (88). His does not drink EtOH per report. I recommended further labs (B6 and folate) as well as a lumbar puncture to look for an inflammatory cause. Patient agreed.  He will come to our office to get the labs and wait to be called for LP and let me know if he does not hear anything in next couple of weeks.  All questions were answered.  Jacquelyne Balint, MD [facility]

## 2022-11-24 ENCOUNTER — Other Ambulatory Visit (INDEPENDENT_AMBULATORY_CARE_PROVIDER_SITE_OTHER): Payer: Medicare Other

## 2022-11-24 DIAGNOSIS — W19XXXS Unspecified fall, sequela: Secondary | ICD-10-CM

## 2022-11-24 DIAGNOSIS — R29898 Other symptoms and signs involving the musculoskeletal system: Secondary | ICD-10-CM

## 2022-11-24 DIAGNOSIS — R200 Anesthesia of skin: Secondary | ICD-10-CM

## 2022-11-24 DIAGNOSIS — R2689 Other abnormalities of gait and mobility: Secondary | ICD-10-CM

## 2022-11-24 DIAGNOSIS — R269 Unspecified abnormalities of gait and mobility: Secondary | ICD-10-CM

## 2022-11-24 DIAGNOSIS — G250 Essential tremor: Secondary | ICD-10-CM | POA: Diagnosis not present

## 2022-11-24 DIAGNOSIS — M21371 Foot drop, right foot: Secondary | ICD-10-CM

## 2022-11-24 DIAGNOSIS — G629 Polyneuropathy, unspecified: Secondary | ICD-10-CM

## 2022-11-25 LAB — HEPB+HEPC+HIV PANEL
HIV Screen 4th Generation wRfx: NONREACTIVE
Hep B C IgM: NEGATIVE
Hep B Core Total Ab: NEGATIVE
Hep B E Ab: NONREACTIVE
Hep B E Ag: NEGATIVE
Hep B Surface Ab, Qual: NONREACTIVE
Hep C Virus Ab: NONREACTIVE
Hepatitis B Surface Ag: NEGATIVE

## 2022-11-25 LAB — FOLATE: Folate: >24.2 ng/mL (ref >5.9)

## 2022-11-27 LAB — VITAMIN B6: Vitamin B6: 31.1 ng/mL — ABNORMAL HIGH (ref 2.1–21.7)

## 2022-12-03 NOTE — Discharge Instructions (Signed)

## 2022-12-04 ENCOUNTER — Other Ambulatory Visit (HOSPITAL_COMMUNITY)
Admission: RE | Admit: 2022-12-04 | Discharge: 2022-12-04 | Disposition: A | Discharge status: Home / Self Care | Payer: Medicare Other | Source: Ambulatory Visit | Attending: Neurology | Admitting: Neurology

## 2022-12-04 ENCOUNTER — Ambulatory Visit
Admission: RE | Admit: 2022-12-04 | Discharge: 2022-12-04 | Disposition: A | Discharge status: Home / Self Care | Payer: Medicare Other | Source: Ambulatory Visit | Attending: Neurology | Admitting: Neurology

## 2022-12-04 DIAGNOSIS — G629 Polyneuropathy, unspecified: Secondary | ICD-10-CM

## 2022-12-04 DIAGNOSIS — R2689 Other abnormalities of gait and mobility: Secondary | ICD-10-CM

## 2022-12-04 DIAGNOSIS — W19XXXS Unspecified fall, sequela: Secondary | ICD-10-CM | POA: Insufficient documentation

## 2022-12-04 DIAGNOSIS — R200 Anesthesia of skin: Secondary | ICD-10-CM | POA: Insufficient documentation

## 2022-12-04 DIAGNOSIS — M21371 Foot drop, right foot: Secondary | ICD-10-CM | POA: Insufficient documentation

## 2022-12-04 DIAGNOSIS — R29898 Other symptoms and signs involving the musculoskeletal system: Secondary | ICD-10-CM

## 2022-12-04 DIAGNOSIS — M21372 Foot drop, left foot: Secondary | ICD-10-CM | POA: Insufficient documentation

## 2022-12-04 DIAGNOSIS — G250 Essential tremor: Secondary | ICD-10-CM | POA: Insufficient documentation

## 2022-12-04 DIAGNOSIS — R269 Unspecified abnormalities of gait and mobility: Secondary | ICD-10-CM

## 2022-12-04 NOTE — Progress Notes (Signed)
1 vial of blood drawn from pts RAC to be sent off with LP lab work. 1 successful attempt, pt tolerated well. Gauze and tape applied after.

## 2022-12-07 ENCOUNTER — Telehealth: Payer: Self-pay | Admitting: Neurology

## 2022-12-07 LAB — CYTOLOGY - NON PAP

## 2022-12-07 NOTE — Telephone Encounter (Signed)
Left message with the after service on 12-05-22 at 11:53 am   Cell count/ diff unable to report due to insufficient analyzable cell collected on 12-04-22    Lab reference [id number]

## 2022-12-07 NOTE — Telephone Encounter (Signed)
Called [facility] and gave Lab Ref # and she said there was nothing in the chart but I Stat results, she is faxing it over to [place] at [phone].

## 2023-01-04 LAB — CNS IGG SYNTHESIS RATE, CSF+BLOOD
Albumin Serum: 4.5 g/dL (ref 3.6–5.1)
Albumin, CSF: 21.6 mg/dL (ref 8.0–42.0)
CNS-IgG Synthesis Rate: -2.7 mg/(24.h) (ref <=3.3)
IgG (Immunoglobin G), Serum: 874 mg/dL (ref 600–1540)
IgG Total CSF: 2 mg/dL (ref 0.8–7.7)
IgG-Index: 0.48 (ref <0.70)

## 2023-01-04 LAB — FUNGUS CULTURE W SMEAR
CULTURE:: NO GROWTH
MICRO NUMBER:: 15463852
SMEAR:: NONE SEEN
SPECIMEN QUALITY:: ADEQUATE

## 2023-01-04 LAB — CSF CELL COUNT WITH DIFFERENTIAL
RBC Count, CSF: 0 {cells}/uL
TOTAL NUCLEATED CELL: 1 {cells}/uL (ref 0–5)

## 2023-01-04 LAB — OLIGOCLONAL BANDS, CSF + SERM: Oligo Bands: ABSENT

## 2023-01-04 LAB — CSF CULTURE W GRAM STAIN
MICRO NUMBER:: 15463853
Result:: NO GROWTH
SPECIMEN QUALITY:: ADEQUATE

## 2023-01-04 LAB — GLUCOSE, CSF: Glucose, CSF: 65 mg/dL (ref 40–80)

## 2023-01-04 LAB — PROTEIN, CSF: Total Protein, CSF: 44 mg/dL (ref 15–60)

## 2023-01-13 IMAGING — CT CT CHEST W/O CM
2 of 4 series · 15 of 36 positions shown, 18 images · non-contrast
Comparison: Chest radiograph on 02/08/2020

CLINICAL DATA: Nonproductive cough. Pulmonary opacity on recent
chest radiograph.

EXAM:
CT CHEST WITHOUT CONTRAST
TECHNIQUE: Multidetector CT imaging of the chest was performed following the
standard protocol without IV contrast.

[Series 2: routine chest without · axial · non-contrast · 0.69mm/px · z∈[-136,+122]mm · 12 of 153 slices shown, 15 images]
[im 12/153  mediastinal]
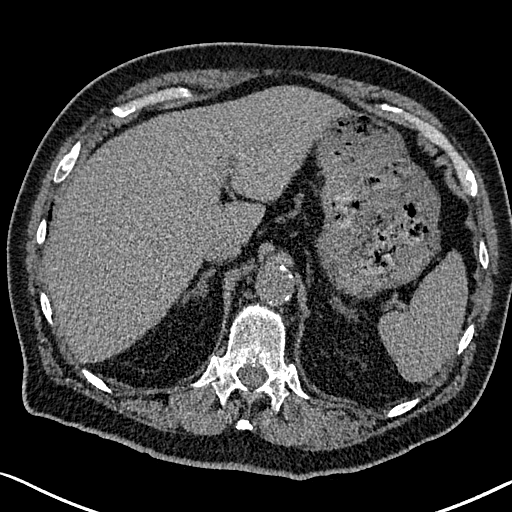
[im 12/153  lung]
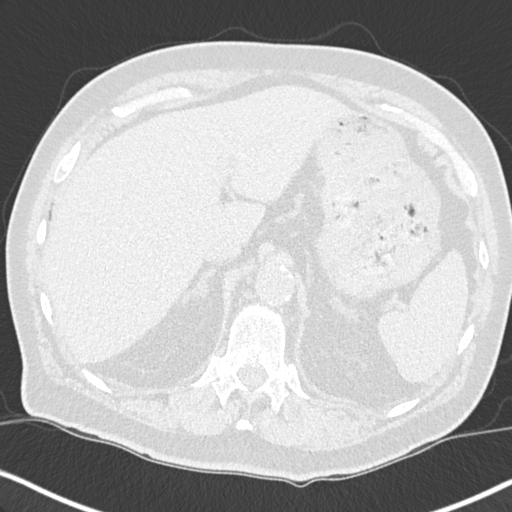
[im 24/153  lung]
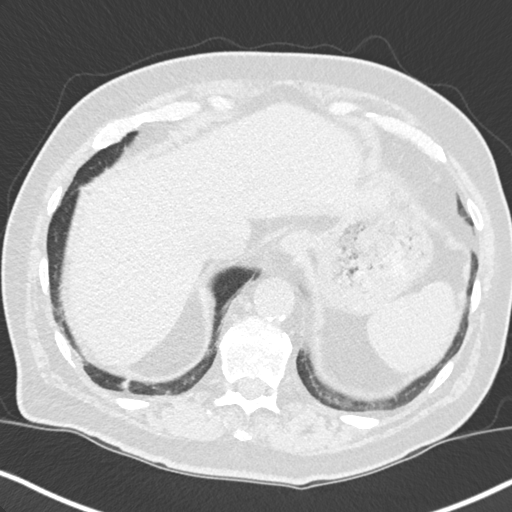
[im 36/153  lung]
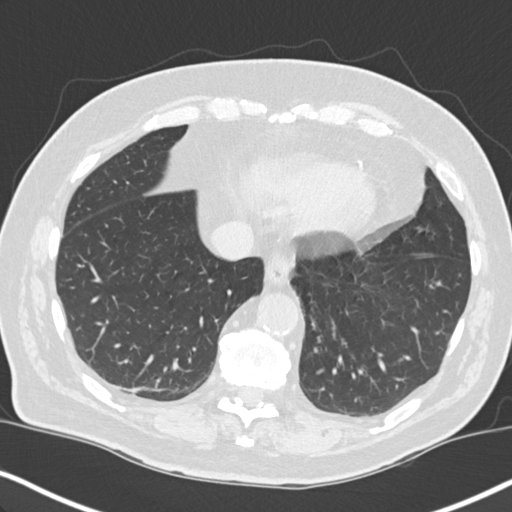
[im 47/153  lung]
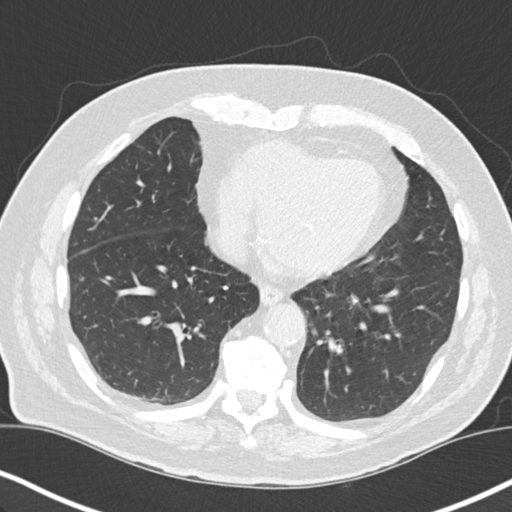
[im 59/153  mediastinal]
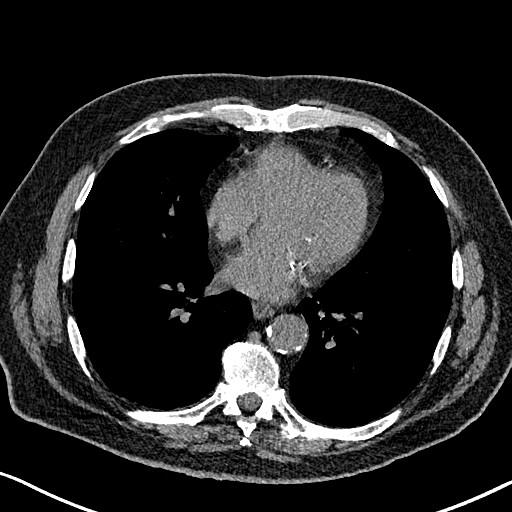
[im 59/153  lung]
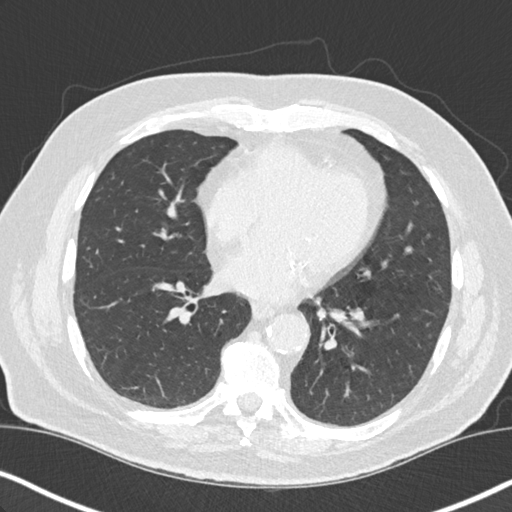
[im 71/153  lung]
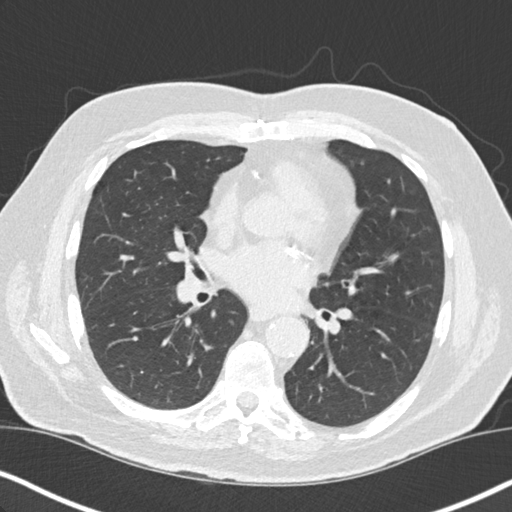
[im 82/153  lung]
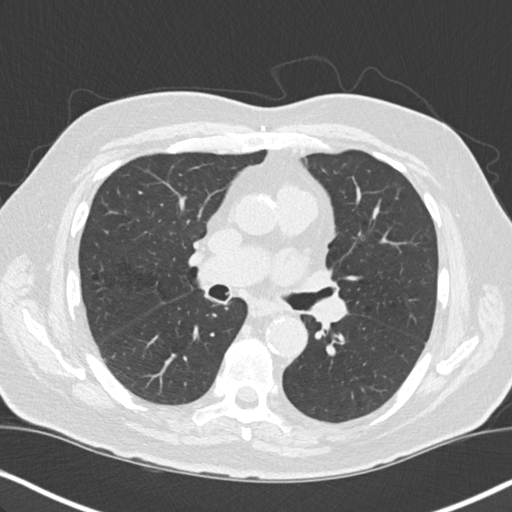
[im 94/153  lung]
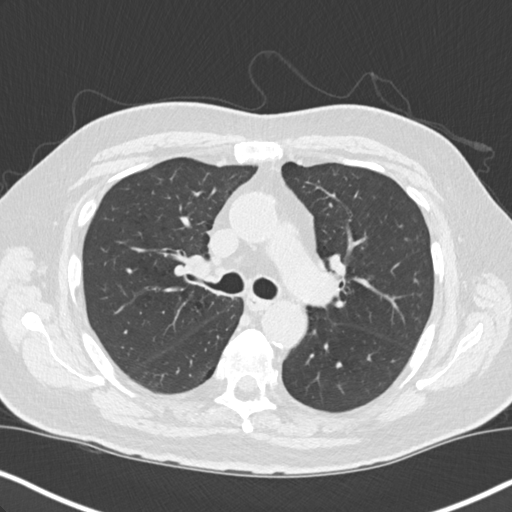
[im 106/153  mediastinal]
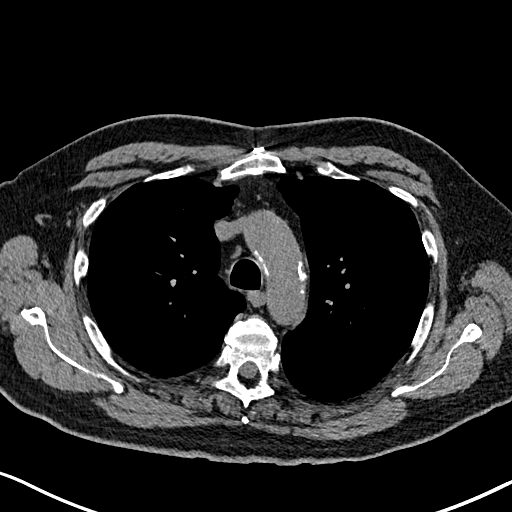
[im 106/153  lung]
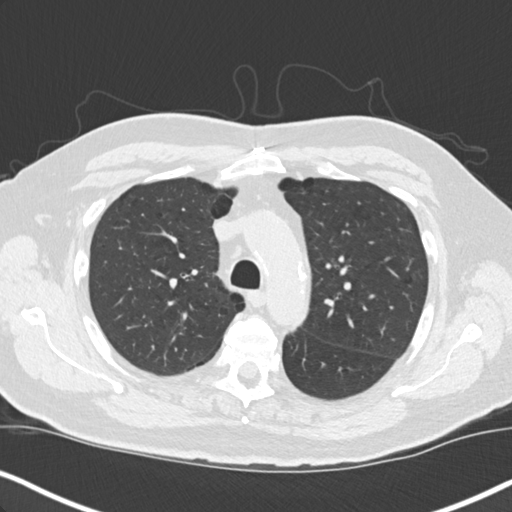
[im 117/153  lung]
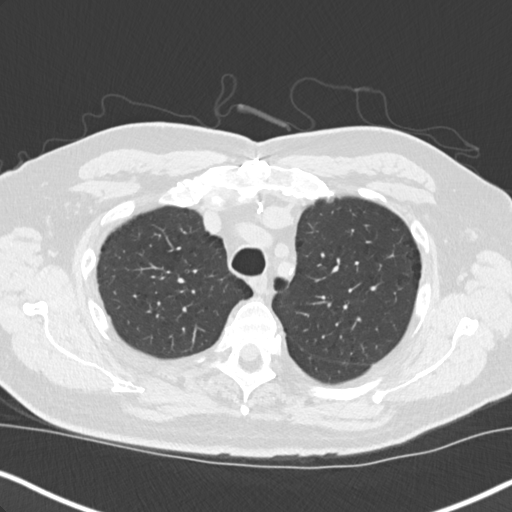
[im 129/153  lung]
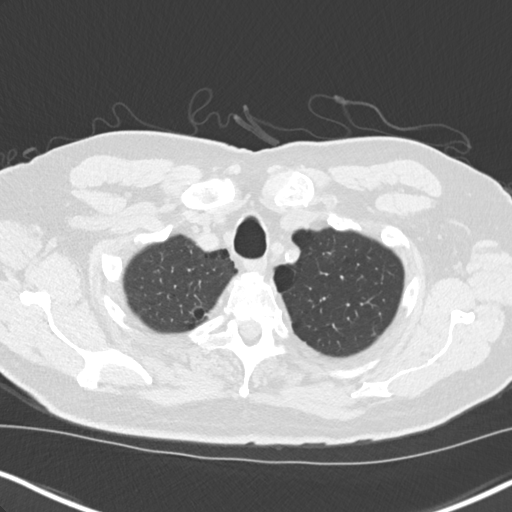
[im 141/153  lung]
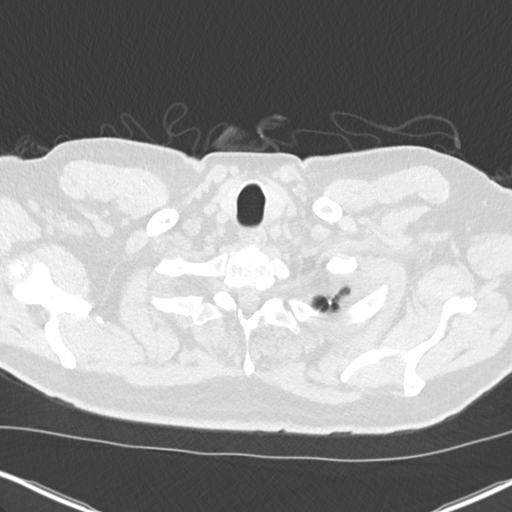

[Series 5: coronal · coronal · 0.63mm/px · 3 of 151 slices shown]
[im 31/151  lung]
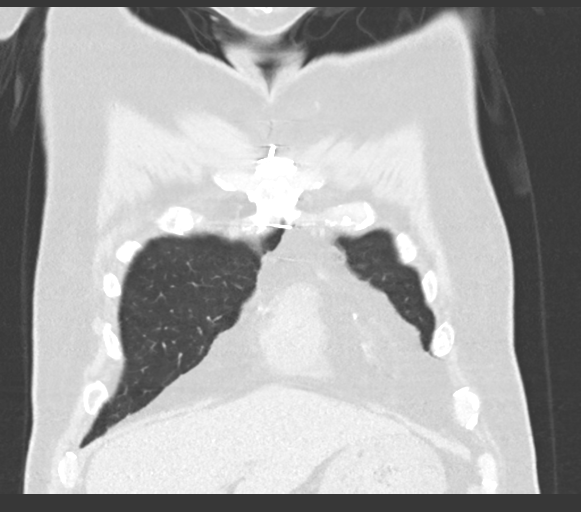
[im 61/151  lung]
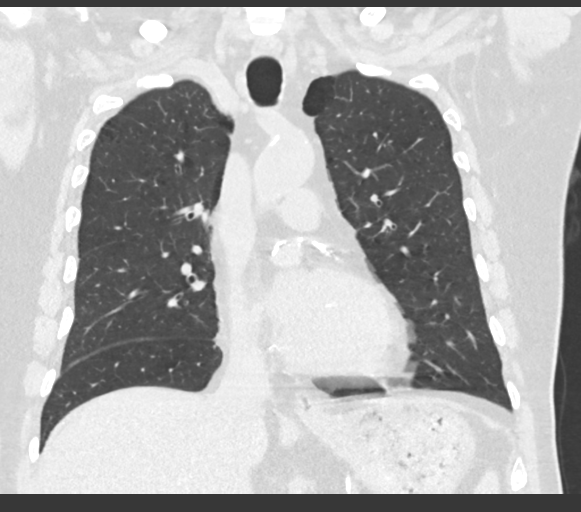
[im 91/151  lung]
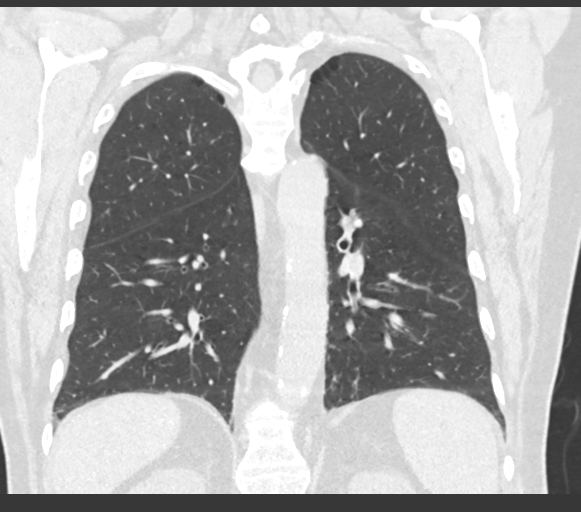

[15 of 36 positions shown; findings below may reference images not displayed]

FINDINGS: Cardiovascular: No acute findings. Aortic and coronary
atherosclerotic calcification noted. Prior median sternotomy noted.

Mediastinum/Nodes: No masses or pathologically enlarged lymph nodes
identified on this unenhanced exam.

Lungs/Pleura: Mild paraseptal emphysema noted. Mild bilateral
pleural-parenchymal scarring seen. No suspicious nodules or masses
identified. No evidence of infiltrate or pleural effusion.

Upper Abdomen: Probable small right hepatic lobe cyst noted.
Otherwise unremarkable.

Musculoskeletal:  No suspicious bone lesions.
IMPRESSION: No evidence of pulmonary neoplasm or other active disease within the
thorax.

Aortic Atherosclerosis (8KN7E-7CY.Y) and Emphysema ([id number].S).

## 2023-04-14 NOTE — Progress Notes (Signed)
I saw Julian Castro in neurology clinic on 04/23/23 in follow up for difficulty walking, numbness and tingling in feet and hands, and weakness.  HPI: Julian Castro is a 78 y.o. year old right-handed male with a medical history of hypothyroidism, DM c/b neuropathy, CAD s/p CABG, HTN, HLD who we last saw on 10/21/22.  To briefly review: 10/21/22: Patient does not think he has problems with his walking. He has fallen a few times in the yard, but he thinks this can be explained. Patient has had numbness in his feet for at least 7 years. He has no pain. He does not think his legs are weak. He denies weakness in the hands. He sometimes has numbness in his finger tips. He denies frequently leaning on his elbows. He denies muscle cramps or twitching.   The patient denies symptoms suggestive of oculobulbar weakness including diplopia, ptosis, dysphagia, poor saliva control, dysarthria/dysphonia, impaired mastication, facial weakness/droop.   There are no neuromuscular respiratory weakness symptoms, particularly orthopnea>dyspnea.    Pseudobulbar affect is absent.   He does not report any constitutional symptoms like fever, night sweats, anorexia or unintentional weight loss.   Regarding his tremor, it started 5-7 years ago. The tremors are in his hands and neck. It is more pronounced when he is trying to use his hands. The tremor causing difficulty with eating and drinking. He was previously on a medication for tremor. He is not sure what the medication was, he isn't sure it helped, but he felt like he didn't want to do anything. Wife thinks the medication might have been primidone. Patient is currently not on medication.   Patient takes B complex and a multivitamin.   EtOH use: Rare  Restrictive diet? No Family history of neuropathy/myopathy/neurologic disease? No  Most recent Assessment and Plan (10/21/22): Julian Castro is a 78 y.o. male who presents for evaluation of numbness in legs, leg and hand  weakness, imbalance, falls, and tremor. He has a relevant medical history of hypothyroidism, DM c/b neuropathy, CAD s/p CABG, HTN, HLD. His neurological examination is pertinent for intrinsic hand muscle weakness and trophy, bilateral foot drop, numbness of legs and hands, and gait imbalance. Available diagnostic data is significant for HbA1c of 5.8 (unclear past levels). Patient has significant weakness and sensory deficits that may be secondary to neuropathy (?secondary to diabetes). His level of weakness and atrophy appears out of proportion to current HbA1c though. I would like to investigate further with labs looking for treatable causes and EMG.   PLAN: -Blood work: B1, B12, IFE -EMG: PN protocol (R > L) -PT/OT ([place], [place] preferred) - patient will likely need AFO -For essential tremor, will start propranolol 20 mg BID  Since their last visit: Patient is about the same as prior. He continues to numbness from knees down, but no significant pain. He has imbalance and walks with a cane. He has not fallen in the last year. He still has tremors but says he is managing. Wife states his shaking sometimes causes him to spill. He is not taking propranolol.  He never was called by PT/OT. He does not think he has troubles and not sure he needs it. He continues to drive.  Lab work was significant for low B12 (88) and elevated B1. I recommended B12 supplementation. Patient had the injections. He was rechecked at the [place], but he is unsure the results. He is now taking 1000 mcg daily.  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  I recommended lumbar puncture and B6 and folate serum testing. B6 was mildly elevated (31.1). Folate and hepatitis/HIV panel was negative. CSF analysis was normal.  EtOH use: None in 18 years. He was previously a heavy weekend drinker.   MEDICATIONS:  Outpatient Encounter Medications as of 04/23/2023   Medication Sig   amLODipine (NORVASC) 2.5 MG tablet Take 5 mg by mouth daily.   aspirin EC 81 MG tablet Take 81 mg by mouth daily. Swallow whole.   atorvastatin (LIPITOR) 40 MG tablet Take 40 mg by mouth daily.   cyanocobalamin (VITAMIN B12) 1000 MCG tablet Take 1,000 mcg by mouth daily.   fluticasone-salmeterol (WIXELA INHUB) 100-50 MCG/ACT AEPB Inhale 1 puff into the lungs 2 (two) times daily.   insulin glargine-yfgn (SEMGLEE) 100 UNIT/ML injection Inject 40 Units into the skin in the morning and at bedtime.   Ipratropium-Albuterol (COMBIVENT RESPIMAT) 20-100 MCG/ACT AERS respimat Inhale 1 puff into the lungs every 6 (six) hours as needed for wheezing.   levothyroxine (SYNTHROID) 88 MCG tablet Take 88 mcg by mouth daily before breakfast.   metFORMIN (GLUCOPHAGE) 1000 MG tablet Take 1,000 mg by mouth 2 (two) times daily with a meal.   Multiple Vitamin (MULTIVITAMIN) capsule Take 1 capsule by mouth daily.   telmisartan (MICARDIS) 80 MG tablet Take 1 tablet (80 mg total) by mouth daily.   B Complex-C (SUPER B COMPLEX PO) Take 1 tablet by mouth daily. (Patient not taking: Reported on 04/23/2023)   insulin detemir (LEVEMIR) 100 UNIT/ML injection Inject 75 Units into the skin daily. (Patient not taking: Reported on 04/23/2023)   propranolol (INDERAL) 20 MG tablet Take 1 tablet (20 mg total) by mouth 2 (two) times daily. (Patient not taking: Reported on 04/23/2023)   No facility-administered encounter medications on file as of 04/23/2023.    PAST MEDICAL HISTORY: Past Medical History:  Diagnosis Date   Broken ribs 06/04/1988   9th and 10th rib   colonoscopy 04/23/2020   Coronary artery disease    Diabetes mellitus without complication (HCC)    Heart bloc stents 10/21/1995   heart bypass 06/29/1995   HLD (hyperlipidemia)    Hypertension    Hypothyroidism     PAST SURGICAL HISTORY: History reviewed. No pertinent surgical history.  ALLERGIES: No Known Allergies  FAMILY HISTORY: Family  History  Problem Relation Age of Onset   Lung cancer Mother    Cancer - Other Father     SOCIAL HISTORY: Social History   Tobacco Use   Smoking status: Former    Current packs/day: 0.00    Average packs/day: 2.5 packs/day for 40.0 years (100.0 ttl pk-yrs)    Types: Cigarettes    Start date: 03/23/1965    Quit date: 03/23/2005    Years since quitting: 18.0   Smokeless tobacco: Never  Vaping Use   Vaping status: Never Used  Substance Use Topics   Alcohol use: Not Currently   Social History   Social History Narrative   Are you right handed or left handed? RIGHT   Are you currently employed ?    What is your current occupation?RETIRED   Do you live at home alone?   Who lives with you? WIFE   What type of home do you live in: 1 story or 2 story? ONE    CAFFEINE 2 CUPS A DAY    Objective:  Vital Signs:  BP 135/62 (BP Location: Left Arm, Patient Position: Sitting, Cuff Size: Large)  Pulse 79   Ht 5' 11.5" (1.816 m)   Wt 208 lb (94.3 kg)   SpO2 96%   BMI 28.61 kg/m   General: General appearance: Awake and alert. No distress. Cooperative with exam.  Skin: No obvious rash or jaundice. HEENT: Atraumatic. Anicteric. Lungs: Non-labored breathing on room air  Extremities: No edema. No obvious deformity.  Musculoskeletal: No obvious joint swelling.  Neurological: Mental Status: Alert. Speech fluent. No pseudobulbar affect Cranial Nerves: CNII: No RAPD. Visual fields intact. CNIII, IV, VI: PERRL. No nystagmus. EOMI. CN V: Facial sensation intact bilaterally to fine touch. CN VII: Facial muscles symmetric and strong. No ptosis at rest. CN VIII: Hears finger rub well bilaterally. CN IX: No hypophonia. CN X: Palate elevates symmetrically. CN XI: Full strength shoulder shrug bilaterally. CN XII: Tongue protrusion full and midline. No atrophy or fasciculations. No significant dysarthria Motor: Tone is normal. Intention tremor in bilateral upper extremities. Atrophy of  intrinsic hand muscles.  Individual muscle group testing (MRC grade out of 5):  Movement     Neck flexion 5    Neck extension 5     Right Left   Shoulder abduction 5 5   Shoulder adduction 5 5   Shoulder ext rotation 5 5   Shoulder int rotation 5 5   Elbow flexion 5 5   Elbow extension 5 5   Finger abduction - FDI 4 4   Finger abduction - ADM 4 4   Finger extension 4+ 4+   Finger distal flexion - 2/3 4+ 4+   Finger distal flexion - 4/5 4+ 4+   Thumb flexion - FPL 4+ 4+   Thumb abduction - APB 4 4    Hip flexion 5 5   Hip extension 5 5   Hip adduction 5 5   Hip abduction 5 5   Knee extension 5 5   Knee flexion 5 5   Dorsiflexion 2 2   Plantarflexion 5 5   Inversion 4+ 4+   Eversion 4+ 4+    Reflexes:  Right Left  Bicep 0 0  Tricep 0 0  BrRad 0 0  Knee 0 0  Ankle 0 0   Sensation: Pinprick: Diminished to calves in bilateral lower extremities and in bilateral hands Vibration: Absent in lower extremities to the knees bilaterally Coordination: Intact finger-to- nose-finger bilaterally. Romberg with significant sway. Gait: Steppage, slap gait. Unsteady, wide-based.   Lab and Test Review: New results: CSF (12/04/22): Opening pressure 12.5 cm water 0 RBC, 1 WBC, 44 protein, 65 glucose Cytology negative Culture and stain (fungal and bacterial) negative IgG index wnl Oligoclonal bands absent  11/24/22: Folate wnl Hep B, Hep C, HIV panel: negative B6 mildly elevated to 31.1  10/21/22: IFE no M protein B1 elevated to 101 B12: low at 88  EMG (11/16/22): NCV & EMG Findings: Extensive electrodiagnostic evaluation of the right upper and lower limbs shows: Right sural, superficial peroneal/fibular, median, and ulnar sensory responses are absent. Right radial sensory response is within normal limits. Right peroneal/fibular (EDB) and tibial (AH) motor responses are absent. Right peroneal/fibular (TA), median (APB), and ulnar (ADM) motor responses show reduced amplitudes  (TA 1.44 mV, APB 1.80 mV, ADM 3.3 mV). Right ulnar (ADM) conduction velocities are decreased from below elbow to wrist (34 ms) and above elbow to below elbow (40 ms). Right H reflex is absent. Chronic motor axon loss changes WITH active denervation changes are seen in the right tibialis anterior and medial head of gastrocnemius muscles.  Chronic motor axon loss changes WITHOUT evidence of active denervation changes are seen in the right short head of biceps femoris, first dorsal interosseous, extensor indicis proprius, abductor pollicis brevis, pronator teres, and flexor carpi ulnaris muscles. Lumbosacral (L5 level) and cervical (C7 level) paraspinal muscles are within normal limits.   Impression: This is an abnormal study. 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 No definitive electrodiagnostic evidence of a right cervical (C5-T1) or lumbosacral (L3-S1) motor radiculopathy. No electrodiagnostic evidence of a right median mononeuropathy at or distal to the wrist, ie: carpal tunnel syndrome. An overlapping right ulnar mononeuropathy is possible, but changes may be due to #1 above.  Previously reviewed results: 09/03/22: HbA1c: 5.8 CMP unremarkable CBC unremarkable TSH wnl Lipid panel: total cholesterol 120, LDL 71  ASSESSMENT: This is Julian Castro, a 78 y.o. 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 Patient's known risk factors include B12 deficiency and very mild DM. LP showed no concern for inflammatory neuropathy. He was a heavy drinker in the past, but not for about 18 years. Overall, his symptoms are stable from prior. He has significant foot drop and hand weakness and would benefit for PT, OT, and assessment for bilateral AFOs. This was previously ordered, but patient  states he was not contacted about and wasn't sure he needed it. He agrees to this assessment today. Tremor - consistent with essential tremor. Patient does not find it bothersome or needing treatment at this time. I had previously prescribed propranolol, but patient did not want to take it. B12 deficiency - 88 in 09/2022  Plan: -PT/OT [facility] preferred) - would benefit from bilateral AFO for more safe ambulation -Blood work: MMA, B12 -Fall precautions discussed  Return to clinic in 1 year  Total time spent reviewing records, interview, history/exam, documentation, and coordination of care on day of encounter:  40 min  Jacquelyne Balint, MD

## 2023-04-23 ENCOUNTER — Other Ambulatory Visit: Payer: Medicare Other

## 2023-04-23 ENCOUNTER — Ambulatory Visit: Payer: Medicare Other | Admitting: Neurology

## 2023-04-23 ENCOUNTER — Encounter: Payer: Self-pay | Admitting: Neurology

## 2023-04-23 VITALS — BP 135/62 | HR 79 | Ht 71.5 in | Wt 208.0 lb

## 2023-04-23 DIAGNOSIS — G629 Polyneuropathy, unspecified: Secondary | ICD-10-CM

## 2023-04-23 DIAGNOSIS — R200 Anesthesia of skin: Secondary | ICD-10-CM

## 2023-04-23 DIAGNOSIS — M21371 Foot drop, right foot: Secondary | ICD-10-CM | POA: Diagnosis not present

## 2023-04-23 DIAGNOSIS — G250 Essential tremor: Secondary | ICD-10-CM

## 2023-04-23 DIAGNOSIS — R2689 Other abnormalities of gait and mobility: Secondary | ICD-10-CM

## 2023-04-23 DIAGNOSIS — R29898 Other symptoms and signs involving the musculoskeletal system: Secondary | ICD-10-CM

## 2023-04-23 DIAGNOSIS — R269 Unspecified abnormalities of gait and mobility: Secondary | ICD-10-CM

## 2023-04-23 DIAGNOSIS — M21372 Foot drop, left foot: Secondary | ICD-10-CM

## 2023-04-23 NOTE — Patient Instructions (Addendum)
I want to check a bit more lab work today.  Continue B12 1000 mcg daily.  I will refer you again to physical therapy and occupational therapy. I think you need braces for your ankles to help you walk better. If you do not hear from someone in 2 weeks, please let us know.  I will see you again in 1 year.   Please let me know if you have any questions or concerns in the meantime.  The physicians and staff at [facility] are committed to providing excellent care. You may receive a survey requesting feedback about your experience at our office. We strive to receive "very good" responses to the survey questions. If you feel that your experience would prevent you from giving the office a "very good " response, please contact our office to try to remedy the situation. We may be reached at [phone]. Thank you for taking the time out of your busy day to complete the survey.  Jacquelyne Balint, MD [facility]  Preventing Falls at [facility] are common, often dreaded events in the lives of older people. Aside from the obvious injuries and even death that may result, fall can cause wide-ranging consequences including loss of independence, mental decline, decreased activity and mobility. Younger people are also at risk of falling, especially those with chronic illnesses and fatigue.  Ways to reduce risk for falling Examine diet and medications. Warm foods and alcohol dilate blood vessels, which can lead to dizziness when standing. Sleep aids, antidepressants and pain medications can also increase the likelihood of a fall.  Get a vision exam. Poor vision, cataracts and glaucoma increase the chances of falling.  Check foot gear. Shoes should fit snugly and have a sturdy, nonskid sole and a broad, low heel  Participate in a physician-approved exercise program to build and maintain muscle strength and improve balance and coordination. Programs that use ankle weights or stretch bands are excellent  for muscle-strengthening. Water aerobics programs and low-impact Tai Chi programs have also been shown to improve balance and coordination.  Increase vitamin D intake. Vitamin D improves muscle strength and increases the amount of calcium the body is able to absorb and deposit in bones.  How to prevent falls from common hazards Floors - Remove all loose wires, cords, and throw rugs. Minimize clutter. Make sure rugs are anchored and smooth. Keep furniture in its usual place.  Chairs -- Use chairs with straight backs, armrests and firm seats. Add firm cushions to existing pieces to add height.  Bathroom - Install grab bars and non-skid tape in the tub or shower. Use a bathtub transfer bench or a shower chair with a back support Use an elevated toilet seat and/or safety rails to assist standing from a low surface. Do not use towel racks or bathroom tissue holders to help you stand.  Lighting - Make sure halls, stairways, and entrances are well-lit. Install a night light in your bathroom or hallway. Make sure there is a light switch at the top and bottom of the staircase. Turn lights on if you get up in the middle of the night. Make sure lamps or light switches are within reach of the bed if you have to get up during the night.  Kitchen - Install non-skid rubber mats near the sink and stove. Clean spills immediately. Store frequently used utensils, pots, pans between waist and eye level. This helps prevent reaching and bending. Sit when getting things out of lower cupboards.  Living room/  Bedrooms - Place furniture with wide spaces in between, giving enough room to move around. Establish a route through the living room that gives you something to hold onto as you walk.  Stairs - Make sure treads, rails, and rugs are secure. Install a rail on both sides of the stairs. If stairs are a threat, it might be helpful to arrange most of your activities on the lower level to reduce the number of times you must  climb the stairs.  Entrances and doorways - Install metal handles on the walls adjacent to the doorknobs of all doors to make it more secure as you travel through the doorway.  Tips for maintaining balance Keep at least one hand free at all times. Try using a backpack or fanny pack to hold things rather than carrying them in your hands. Never carry objects in both hands when walking as this interferes with keeping your balance.  Attempt to swing both arms from front to back while walking. This might require a conscious effort if Parkinson's disease has diminished your movement. It will, however, help you to maintain balance and posture, and reduce fatigue.  Consciously lift your feet off of the ground when walking. Shuffling and dragging of the feet is a common culprit in losing your balance.  When trying to navigate turns, use a "U" technique of facing forward and making a wide turn, rather than pivoting sharply.  Try to stand with your feet shoulder-length apart. When your feet are close together for any length of time, you increase your risk of losing your balance and falling.  Do one thing at a time. Don't try to walk and accomplish another task, such as reading or looking around. The decrease in your automatic reflexes complicates motor function, so the less distraction, the better.  Do not wear rubber or gripping soled shoes, they might "catch" on the floor and cause tripping.  Move slowly when changing positions. Use deliberate, concentrated movements and, if needed, use a grab bar or walking aid. Count 15 seconds between each movement. For example, when rising from a seated position, wait 15 seconds after standing to begin walking.  If balance is a continuous problem, you might want to consider a walking aid such as a cane, walking stick, or walker. Once you've mastered walking with help, you might be ready to try it on your own again.

## 2023-04-27 ENCOUNTER — Encounter: Payer: Self-pay | Admitting: Neurology

## 2023-04-27 LAB — METHYLMALONIC ACID, SERUM: Methylmalonic Acid, Quant: 267 nmol/L (ref 69–390)

## 2023-04-27 LAB — VITAMIN B12: Vitamin B-12: 218 pg/mL (ref 200–1100)

## 2024-04-26 ENCOUNTER — Ambulatory Visit: Payer: Medicare Other | Admitting: Neurology
